# Patient Record
Sex: Male | Born: 1940 | Hispanic: No | Marital: Married | Smoking: Never smoker
Health system: Southern US, Community
[De-identification: ages and names within clinical notes are randomized; demographics above are authoritative.]

## PROBLEM LIST (undated history)

## (undated) DIAGNOSIS — I739 Peripheral vascular disease, unspecified: Secondary | ICD-10-CM

## (undated) DIAGNOSIS — I10 Essential (primary) hypertension: Secondary | ICD-10-CM

## (undated) DIAGNOSIS — I878 Other specified disorders of veins: Secondary | ICD-10-CM

## (undated) DIAGNOSIS — R600 Localized edema: Secondary | ICD-10-CM

## (undated) HISTORY — PX: CHOLECYSTECTOMY: SHX55

## (undated) HISTORY — PX: MINOR HEMORRHOIDECTOMY: SHX6238

---

## 2000-03-09 ENCOUNTER — Encounter: Admission: RE | Admit: 2000-03-09 | Discharge: 2000-06-07 | Payer: Self-pay | Admitting: Family Medicine

## 2001-04-18 ENCOUNTER — Encounter: Payer: Self-pay | Admitting: Internal Medicine

## 2001-04-18 ENCOUNTER — Ambulatory Visit (HOSPITAL_COMMUNITY): Admission: RE | Admit: 2001-04-18 | Discharge: 2001-04-18 | Payer: Self-pay | Admitting: Internal Medicine

## 2001-11-18 ENCOUNTER — Encounter: Payer: Self-pay | Admitting: Internal Medicine

## 2001-11-18 ENCOUNTER — Ambulatory Visit (HOSPITAL_COMMUNITY): Admission: RE | Admit: 2001-11-18 | Discharge: 2001-11-18 | Payer: Self-pay | Admitting: Internal Medicine

## 2001-12-12 ENCOUNTER — Encounter: Payer: Self-pay | Admitting: Orthopedic Surgery

## 2001-12-12 ENCOUNTER — Encounter: Admission: RE | Admit: 2001-12-12 | Discharge: 2001-12-12 | Payer: Self-pay | Admitting: Orthopedic Surgery

## 2002-06-06 ENCOUNTER — Ambulatory Visit (HOSPITAL_BASED_OUTPATIENT_CLINIC_OR_DEPARTMENT_OTHER): Admission: RE | Admit: 2002-06-06 | Discharge: 2002-06-06 | Payer: Self-pay | Admitting: Surgery

## 2002-07-11 ENCOUNTER — Encounter: Payer: Self-pay | Admitting: *Deleted

## 2002-07-11 ENCOUNTER — Emergency Department (HOSPITAL_COMMUNITY): Admission: EM | Admit: 2002-07-11 | Discharge: 2002-07-11 | Payer: Self-pay | Admitting: Podiatry

## 2002-07-15 ENCOUNTER — Encounter: Payer: Self-pay | Admitting: Surgery

## 2002-07-15 ENCOUNTER — Ambulatory Visit (HOSPITAL_COMMUNITY): Admission: RE | Admit: 2002-07-15 | Discharge: 2002-07-16 | Payer: Self-pay | Admitting: Surgery

## 2002-10-29 ENCOUNTER — Ambulatory Visit (HOSPITAL_COMMUNITY): Admission: RE | Admit: 2002-10-29 | Discharge: 2002-10-29 | Payer: Self-pay | Admitting: Internal Medicine

## 2013-06-11 DIAGNOSIS — H401190 Primary open-angle glaucoma, unspecified eye, stage unspecified: Secondary | ICD-10-CM | POA: Insufficient documentation

## 2013-06-11 DIAGNOSIS — H11249 Scarring of conjunctiva, unspecified eye: Secondary | ICD-10-CM | POA: Insufficient documentation

## 2014-02-05 ENCOUNTER — Ambulatory Visit
Admission: RE | Admit: 2014-02-05 | Discharge: 2014-02-05 | Disposition: A | Discharge status: Home / Self Care | Payer: Medicare Other | Source: Ambulatory Visit | Attending: Family Medicine | Admitting: Family Medicine

## 2014-02-05 ENCOUNTER — Other Ambulatory Visit: Payer: Self-pay | Admitting: Family Medicine

## 2014-02-05 DIAGNOSIS — M25552 Pain in left hip: Secondary | ICD-10-CM

## 2014-07-30 ENCOUNTER — Ambulatory Visit (HOSPITAL_BASED_OUTPATIENT_CLINIC_OR_DEPARTMENT_OTHER): Payer: Medicare Other | Attending: Family Medicine

## 2014-07-30 VITALS — Ht 67.0 in | Wt 148.0 lb

## 2014-07-30 DIAGNOSIS — G4733 Obstructive sleep apnea (adult) (pediatric): Secondary | ICD-10-CM | POA: Diagnosis present

## 2014-07-30 DIAGNOSIS — R0683 Snoring: Secondary | ICD-10-CM | POA: Insufficient documentation

## 2014-08-01 NOTE — Sleep Study (Signed)
   NAME: Darren Shaw DATE OF BIRTH:  [birth year] MEDICAL RECORD NUMBER [id number]  LOCATION: [facility]  PHYSICIAN: Omayra Tulloch D  DATE OF STUDY: 07/30/2014  SLEEP STUDY TYPE: Nocturnal Polysomnogram               REFERRING PHYSICIAN: Leilani Ableeese, Betti D, MD  INDICATION FOR STUDY: Hypersomnia with sleep apnea  EPWORTH SLEEPINESS SCORE:   12/24 HEIGHT: 5\' 7"  (170.2 cm)  WEIGHT: 148 lb (67.132 kg)    Body mass index is 23.17 kg/(m^2).  NECK SIZE: 13 in.  MEDICATIONS: Charted for review  SLEEP ARCHITECTURE: Total sleep time 311 minutes with sleep efficiency 81.7%. Stage I was 11.7%, stage II 66.7%, stage III absent, REM 21.5% of total sleep time. Sleep latency 4 minutes, REM latency 176.5 minutes, awake after sleep onset 64.5 minutes, arousal index 6.2, bedtime medication: None  RESPIRATORY DATA: Apnea hypopnea index (AHI) 13.9 per hour. 72 total events scored including 64 obstructive apneas and 8 hypopneas. All events were while supine. REM AHI 0.9 per hour. There were not enough early events to meet protocol requirements for split CPAP titration  OXYGEN DATA: Moderate snoring with oxygen desaturation to a nadir of 88% and mean saturation 96.6% on room air.  CARDIAC DATA: Sinus rhythm with PVCs  MOVEMENT/PARASOMNIA: No significant movement disturbance, bathroom 1  IMPRESSION/ RECOMMENDATION:   1) Mild obstructive sleep apnea/hypopnea syndrome, AHI 13.9 per hour with supine events. REM AHI 0.9 per hour. Moderate snoring with oxygen desaturation to a nadir of 88% and mean saturation 96.6% on room air. 2) There were not enough early events to meet protocol requirements for split CPAP titration on this study. This patient can return for dedicated CPAP titration study if appropriate.   Waymon BudgeYOUNG,Fateh Kindle D Diplomate, American Board of Sleep Medicine  ELECTRONICALLY SIGNED ON:  08/01/2014, 11:50 AM [facility] PH: [phone]   FX: [phone])  [phone] ACCREDITED BY THE AMERICAN ACADEMY OF SLEEP MEDICINE

## 2014-09-01 ENCOUNTER — Ambulatory Visit (HOSPITAL_BASED_OUTPATIENT_CLINIC_OR_DEPARTMENT_OTHER): Payer: Medicare Other | Attending: Family Medicine | Admitting: Radiology

## 2014-09-01 VITALS — Ht 67.0 in | Wt 160.0 lb

## 2014-09-01 DIAGNOSIS — G4733 Obstructive sleep apnea (adult) (pediatric): Secondary | ICD-10-CM | POA: Insufficient documentation

## 2014-09-05 DIAGNOSIS — G4733 Obstructive sleep apnea (adult) (pediatric): Secondary | ICD-10-CM

## 2014-09-05 NOTE — Sleep Study (Signed)
   NAME: Darren Shaw DATE OF BIRTH:  01/19/1941 MEDICAL RECORD NUMBER [id number]  LOCATION: [facility]  PHYSICIAN: YOUNG,CLINTON D  DATE OF STUDY: 09/01/2014  SLEEP STUDY TYPE: Nocturnal Polysomnogram               REFERRING PHYSICIAN: Leilani Ableeese, Betti D, MD  INDICATION FOR STUDY: Hypersomnia with sleep apnea-CPAP titration  EPWORTH SLEEPINESS SCORE:   12/24 HEIGHT: 5\' 7"  (170.2 cm)  WEIGHT: 72.576 kg (160 lb)    Body mass index is 25.05 kg/(m^2).  NECK SIZE: 13 in.  MEDICATIONS: Charted for review  SLEEP ARCHITECTURE: Total sleep time 217.5 minutes with sleep efficiency 55.1%. Stage I was 16.6%, stage II 76.3%, stage III absent, REM 7.1% of total sleep time. Sleep latency 13 minutes, REM latency 201.5 minutes, awake after sleep onset 9 min  RESPIRATORY DATA: CPAP titration protocol. CPAP was titrated to 12 CWP, AHI 0 per hour. He wore a medium fullface mask.  OXYGEN DATA: Snoring was prevented by CPAP and mean oxygen saturation was 97.8% on room air.  CARDIAC DATA: Sinus rhythm with PACs  MOVEMENT/PARASOMNIA: No significant movement disturbance, no bathroom trips  IMPRESSION/ RECOMMENDATION:   1) Successful CPAP titration to 12 CWP, AHI 0 per hour. He wore a medium F&P Simplus fullface mask with heated humidifier. Snoring was prevented and mean oxygen saturation was 97.8% on room air 2) Baseline polysomnogram on 07/30/2014 recorded AHI 13.9 per hour with body weight 148 pounds.   Waymon BudgeYOUNG,CLINTON D Diplomate, American Board of Sleep Medicine  ELECTRONICALLY SIGNED ON:  09/05/2014, 2:53 PM [facility] PH: [phone]   FX: [phone] ACCREDITED BY THE AMERICAN ACADEMY OF SLEEP MEDICINE

## 2015-09-22 DIAGNOSIS — H524 Presbyopia: Secondary | ICD-10-CM | POA: Insufficient documentation

## 2015-09-22 DIAGNOSIS — H353131 Nonexudative age-related macular degeneration, bilateral, early dry stage: Secondary | ICD-10-CM | POA: Insufficient documentation

## 2015-09-22 DIAGNOSIS — H31001 Unspecified chorioretinal scars, right eye: Secondary | ICD-10-CM | POA: Insufficient documentation

## 2015-11-01 ENCOUNTER — Other Ambulatory Visit: Payer: Self-pay | Admitting: Cardiology

## 2015-11-01 DIAGNOSIS — I872 Venous insufficiency (chronic) (peripheral): Secondary | ICD-10-CM

## 2015-11-11 ENCOUNTER — Ambulatory Visit
Admission: RE | Admit: 2015-11-11 | Discharge: 2015-11-11 | Disposition: A | Discharge status: Home / Self Care | Payer: Medicare Other | Source: Ambulatory Visit | Attending: Cardiology | Admitting: Cardiology

## 2015-11-11 DIAGNOSIS — I872 Venous insufficiency (chronic) (peripheral): Secondary | ICD-10-CM

## 2015-11-11 HISTORY — DX: Other specified disorders of veins: I87.8

## 2015-11-11 HISTORY — DX: Peripheral vascular disease, unspecified: I73.9

## 2015-11-11 HISTORY — DX: Essential (primary) hypertension: I10

## 2015-11-11 HISTORY — DX: Localized edema: R60.0

## 2015-11-11 NOTE — Consult Note (Signed)
Chief Complaint: Patient was seen in consultation today for  Chief Complaint  Patient presents with  . Advice Only    Consult for Venous Insufficiency   at the request of Ganji,Jay  Referring Physician(s): Ganji,Jay  History of Present Illness: Darren Shaw is a 75 y.o. male referred for evaluation of bilateral lower extremity swelling. Patient describes this has been going on 4-5 years. He uses compression hose. The swelling resolves with leg elevation, essentially gone in the morning when he wakes up, progressive over the day. No history of varicose or spider vein treatment. No history of superficial thrombophlebitis, DVT, PE, or venous ulceration. There is a family history of varicose/spider veins in his sister. He is a retired Airline pilot, sits for long periods of time. Symptoms do not require pain medications. He also describes bilateral lower extremity numbness after 10-15 minutes of walking or standing, which affect his activities of daily living. Past Medical History  Diagnosis Date  . Hypertension   . Venous intermittent claudication (HCC)   . PAD (peripheral artery disease) (HCC)   . Edema of both legs     Past Surgical History  Procedure Laterality Date  . Cholecystectomy    . Minor hemorrhoidectomy      Allergies: Review of patient's allergies indicates no known allergies.  Medications: Prior to Admission medications   Medication Sig Start Date End Date Taking? Authorizing Provider  amLODipine (NORVASC) 5 MG tablet Take 5 mg by mouth daily.   Yes Historical Provider, MD  atorvastatin (LIPITOR) 10 MG tablet Take 10 mg by mouth daily.   Yes Historical Provider, MD  brimonidine-timolol (COMBIGAN) 0.2-0.5 % ophthalmic solution Place 1 drop into both eyes every 12 (twelve) hours.   Yes Historical Provider, MD  lisinopril-hydrochlorothiazide (PRINZIDE,ZESTORETIC) 20-12.5 MG tablet Take 1 tablet by mouth daily.   Yes Historical Provider, MD  metoprolol tartrate  (LOPRESSOR) 25 MG tablet Take 25 mg by mouth 2 (two) times daily.   Yes Historical Provider, MD  MULTIPLE VITAMIN PO Take 1 capsule by mouth daily.   Yes Historical Provider, MD     No family history on file.  Social History   Social History  . Marital Status: Married    Spouse Name: N/A  . Number of Children: N/A  . Years of Education: N/A   Social History Main Topics  . Smoking status: Never Smoker   . Smokeless tobacco: Never Used  . Alcohol Use: No  . Drug Use: No  . Sexual Activity: Not Asked   Other Topics Concern  . None   Social History Narrative  . None    ECOG Status: 1 - Symptomatic but completely ambulatory  Review of Systems  Review of Systems: A 12 point ROS discussed and pertinent positives are indicated in the HPI above.  All other systems are negative.  Vital Signs: BP 134/65 mmHg  Pulse 77  Temp(Src) 98.4 F (36.9 C) (Oral)  Resp 13  Ht  (1.676 m)  Wt 148 lb (67.132 kg)  BMI 23.90 kg/m2  SpO2 100%  Physical Exam Bilateral 1+ ankle edema. No visible varicose veins. No skin trophic changes, rubor, or ulceration.   Imaging: US Venous Img Lower Bilateral  11/11/2015  CLINICAL DATA:  Bilateral lower extremity swelling EXAM: BILATERAL LOWER EXTREMITY VENOUS DUPLEX ULTRASOUND TECHNIQUE: Gray-scale sonography with graded compression, as well as color Doppler and duplex ultrasound, were performed to evaluate the deep and superficial veins of both lower extremities. Spectral Doppler  was utilized to evaluate flow at rest and with distal augmentation maneuvers. A complete superficial venous insufficiency exam was performed in the upright standing position. I personally performed the technical portion of the exam. COMPARISON:  None. FINDINGS: Deep Venous System: Evaluation of the deep venous systems including the common femoral, femoral, profunda femoral, popliteal and calf veins (where visible) demonstrate no evidence of deep venous thrombosis. The  vessels are compressible and demonstrate normal respiratory phasicity and response to augmentation. No evidence of the deep venous reflux. Superficial Venous System: RIGHT SFJ: Patent, nondilated, no reflux. GSV Mid Thigh: Nondilated, no reflux GSV Lower Thigh: Diminutive, no reflux GSV Prox Calf: Negative GSV Mid Calf: Negative GSV Distal Calf: Negative SPJ: Not visualized SSV Prox: Nondilated, no reflux SSV Mid: Negative SSV Distal: Negative LEFT SFJ: Patent, nondilated, no reflux. GSV Prox Thigh: Negative GSV Mid Thigh: Negative GSV Lower Thigh: Negative GSV Prox Calf: Negative GSV Mid Calf: Negative GSV Distal Calf: Negative. SPJ: Not visualized SSV Prox: Nondilated, no reflux SSV Mid: Negative SSV Distal: Negative Other: There prominent subcutaneous superficial veins noted in the calves bilaterally, up to 3 mm diameter on the right, smaller on the left. IMPRESSION: 1. Normal bilateral deep venous systems.  No DVT. 2. No saphenous venous valvular incompetence, dilatation, or reflux bilaterally. Electronically Signed   By: Corlis Leak  Kazim Corrales M.D.   On: 11/11/2015 10:11     Assessment and Plan:  My impression is that this patient's lower extremity edema is not of primarily venous etiology. Deep venous systems of both lower extremities are normal. Superficial saphenous venous systems of both lower extremities show no dilatation, or venous insufficiency, or reflux. There are a few small superficial saphenous veins noted in the calf regions but no visible varicose veins on exam. I reviewed the findings with the patient. We reviewed the pathophysiology of lower extremity edema and the differential diagnosis. He will plan to follow-up primarily with you.  Thank you for this interesting consult.  I greatly enjoyed meeting Darren Shaw and look forward to participating in their care.  A copy of this report was sent to the requesting provider on this date.  Electronically Signed: Fayette Hamada III, DAYNE Shantice Menger 11/11/2015,  10:40 AM   I spent a total of  30 Minutes   in face to face in clinical consultation, greater than 50% of which was counseling/coordinating care for bilateral lower extremity edema.

## 2016-05-18 ENCOUNTER — Emergency Department (HOSPITAL_COMMUNITY): Payer: Medicare Other

## 2016-05-18 ENCOUNTER — Encounter (HOSPITAL_COMMUNITY): Payer: Self-pay | Admitting: *Deleted

## 2016-05-18 ENCOUNTER — Emergency Department (HOSPITAL_COMMUNITY)
Admission: EM | Admit: 2016-05-18 | Discharge: 2016-05-18 | Disposition: A | Discharge status: Home / Self Care | Payer: Medicare Other | Attending: Emergency Medicine | Admitting: Emergency Medicine

## 2016-05-18 DIAGNOSIS — J0130 Acute sphenoidal sinusitis, unspecified: Secondary | ICD-10-CM | POA: Insufficient documentation

## 2016-05-18 DIAGNOSIS — I10 Essential (primary) hypertension: Secondary | ICD-10-CM | POA: Diagnosis not present

## 2016-05-18 DIAGNOSIS — Z79899 Other long term (current) drug therapy: Secondary | ICD-10-CM | POA: Insufficient documentation

## 2016-05-18 DIAGNOSIS — R791 Abnormal coagulation profile: Secondary | ICD-10-CM | POA: Insufficient documentation

## 2016-05-18 DIAGNOSIS — R05 Cough: Secondary | ICD-10-CM | POA: Diagnosis present

## 2016-05-18 DIAGNOSIS — R42 Dizziness and giddiness: Secondary | ICD-10-CM | POA: Insufficient documentation

## 2016-05-18 LAB — RAPID URINE DRUG SCREEN, HOSP PERFORMED
Amphetamines: NOT DETECTED
BARBITURATES: NOT DETECTED
BENZODIAZEPINES: NOT DETECTED
COCAINE: NOT DETECTED
Opiates: NOT DETECTED
Tetrahydrocannabinol: NOT DETECTED

## 2016-05-18 LAB — URINALYSIS, ROUTINE W REFLEX MICROSCOPIC
Bilirubin Urine: NEGATIVE
GLUCOSE, UA: NEGATIVE mg/dL
Hgb urine dipstick: NEGATIVE
Ketones, ur: NEGATIVE mg/dL
LEUKOCYTES UA: NEGATIVE
Nitrite: NEGATIVE
PROTEIN: NEGATIVE mg/dL
SPECIFIC GRAVITY, URINE: 1.018 (ref 1.005–1.030)
pH: 6.5 (ref 5.0–8.0)

## 2016-05-18 LAB — DIFFERENTIAL
BASOS PCT: 0 %
Basophils Absolute: 0 10*3/uL (ref 0.0–0.1)
EOS ABS: 0.3 10*3/uL (ref 0.0–0.7)
EOS PCT: 4 %
LYMPHS ABS: 3.1 10*3/uL (ref 0.7–4.0)
Lymphocytes Relative: 50 %
MONO ABS: 0.7 10*3/uL (ref 0.1–1.0)
Monocytes Relative: 11 %
Neutro Abs: 2.2 10*3/uL (ref 1.7–7.7)
Neutrophils Relative %: 35 %

## 2016-05-18 LAB — COMPREHENSIVE METABOLIC PANEL
ALT: 14 U/L — ABNORMAL LOW (ref 17–63)
ANION GAP: 9 (ref 5–15)
AST: 22 U/L (ref 15–41)
Albumin: 4.6 g/dL (ref 3.5–5.0)
Alkaline Phosphatase: 114 U/L (ref 38–126)
BILIRUBIN TOTAL: 0.5 mg/dL (ref 0.3–1.2)
BUN: 28 mg/dL — ABNORMAL HIGH (ref 6–20)
CHLORIDE: 101 mmol/L (ref 101–111)
CO2: 26 mmol/L (ref 22–32)
Calcium: 9.4 mg/dL (ref 8.9–10.3)
Creatinine, Ser: 0.97 mg/dL (ref 0.61–1.24)
Glucose, Bld: 117 mg/dL — ABNORMAL HIGH (ref 65–99)
POTASSIUM: 3.5 mmol/L (ref 3.5–5.1)
Sodium: 136 mmol/L (ref 135–145)
TOTAL PROTEIN: 8.1 g/dL (ref 6.5–8.1)

## 2016-05-18 LAB — CBC
HEMATOCRIT: 33.7 % — AB (ref 39.0–52.0)
Hemoglobin: 10.2 g/dL — ABNORMAL LOW (ref 13.0–17.0)
MCH: 19.3 pg — ABNORMAL LOW (ref 26.0–34.0)
MCHC: 30.3 g/dL (ref 30.0–36.0)
MCV: 63.7 fL — AB (ref 78.0–100.0)
PLATELETS: 334 10*3/uL (ref 150–400)
RBC: 5.29 MIL/uL (ref 4.22–5.81)
RDW: 20.5 % — AB (ref 11.5–15.5)
WBC: 6.3 10*3/uL (ref 4.0–10.5)

## 2016-05-18 LAB — I-STAT TROPONIN, ED: TROPONIN I, POC: 0 ng/mL (ref 0.00–0.08)

## 2016-05-18 LAB — ETHANOL

## 2016-05-18 LAB — PROTIME-INR
INR: 1.09
PROTHROMBIN TIME: 14.1 s (ref 11.4–15.2)

## 2016-05-18 LAB — APTT: APTT: 41 s — AB (ref 24–36)

## 2016-05-18 MED ORDER — AMOXICILLIN-POT CLAVULANATE 875-125 MG PO TABS: 1.0000 | ORAL_TABLET | Freq: Two times a day (BID) | ORAL | 0 refills | Status: DC

## 2016-05-18 MED ORDER — SODIUM CHLORIDE 0.9 % IV BOLUS (SEPSIS)
1000.0000 mL | Freq: Once | INTRAVENOUS | Status: AC
  Administered 2016-05-18: 1000 mL via INTRAVENOUS

## 2016-05-18 NOTE — Discharge Instructions (Signed)
EXAM: MRI HEAD WITHOUT CONTRAST   TECHNIQUE: Multiplanar, multiecho pulse sequences of the brain and surrounding structures were obtained without intravenous contrast.   COMPARISON:  CT HEAD May 18, 2016   FINDINGS: BRAIN: No reduced diffusion to suggest acute ischemia. No susceptibility artifact to suggest hemorrhage. The ventricles and sulci are normal for patient's age. No suspicious parenchymal signal, masses or mass effect. Patchy supratentorial white matter FLAIR T2 hyperintensities. No abnormal extra-axial fluid collections. No extra-axial masses though, contrast enhanced sequences would be more sensitive.   VASCULAR: Normal major intracranial vascular flow voids present at skull base.   SKULL AND UPPER CERVICAL SPINE: No abnormal sellar expansion. No suspicious calvarial bone marrow signal. Craniocervical junction maintained.   SINUSES/ORBITS: LEFT sphenoethmoidal sinusitis. Mastoid air cells are well aerated. Bilateral ocular lens implants. The included ocular globes and orbital contents are non-suspicious. Old LEFT medial orbital blowout fracture.   OTHER: None.   IMPRESSION: No acute intracranial process.   LEFT sphenoethmoidal sinusitis.   Negative MRI of the head for age (involutional changes and mild chronic small vessel ischemic disease) .     Electronically Signed   By: Awilda Metroourtnay  Bloomer M.D.   On: 05/18/2016 18:34  CLINICAL DATA:  75 year old male with shortness of breath and cough for 2 weeks.   EXAM: CHEST  2 VIEW   COMPARISON:  None.   FINDINGS: The cardiomediastinal silhouette is unremarkable.   There is no evidence of focal airspace disease, pulmonary edema, suspicious pulmonary nodule/mass, pleural effusion, or pneumothorax. No acute bony abnormalities are identified.   IMPRESSION: No active cardiopulmonary disease.     Electronically Signed   By: Harmon PierJeffrey  Hu M.D.   On: 05/18/2016 13:48   Ref Range & Units 13:08  WBC 4.0  - 10.5 K/uL 6.3   RBC 4.22 - 5.81 MIL/uL 5.29   Hemoglobin 13.0 - 17.0 g/dL 29.510.2    HCT 62.139.0 - 30.852.0 % 33.7    MCV 78.0 - 100.0 fL 63.7    MCH 26.0 - 34.0 pg 19.3    MCHC 30.0 - 36.0 g/dL 65.730.3   RDW 84.611.5 - 96.215.5 % 20.5    Platelets 150 - 400 K/uL 334     Ref Range & Units 13:08  Sodium 135 - 145 mmol/L 136   Potassium 3.5 - 5.1 mmol/L 3.5   Chloride 101 - 111 mmol/L 101   CO2 22 - 32 mmol/L 26   Glucose, Bld 65 - 99 mg/dL 952117    BUN 6 - 20 mg/dL 28    Creatinine, Ser 8.410.61 - 1.24 mg/dL 3.240.97   Calcium 8.9 - 40.110.3 mg/dL 9.4   Total Protein 6.5 - 8.1 g/dL 8.1   Albumin 3.5 - 5.0 g/dL 4.6   AST 15 - 41 U/L 22   ALT 17 - 63 U/L 14    Alkaline Phosphatase 38 - 126 U/L 114   Total Bilirubin 0.3 - 1.2 mg/dL 0.5   GFR calc non Af Amer >60 mL/min >60   GFR calc Af Amer >60 mL/min >60   Comments: (NOTE)

## 2016-05-18 NOTE — ED Notes (Signed)
Completed swallow screen, MD to notify this RN if POC occult is still needed.

## 2016-05-18 NOTE — ED Triage Notes (Signed)
Pt reports dizziness for the past three days.  Last night pt had a fall but denies hitting his head or LOC.  Pt reports some back pain from the fall. Pt describes the dizziness as the room is spinning.  Pt denies any nausea but does report being really tired today.  Pt a/o x 4 and normally ambulatory.  Pt also reports his feet feel like they are burning.

## 2016-05-18 NOTE — ED Provider Notes (Signed)
[facility]-EMERGENCY DEPT Provider Note   CSN: [id number] Arrival date & time: 05/18/16  1047     History   Chief Complaint Chief Complaint  Patient presents with  . Dizziness    HPI Matthias Hughssegaye Charlie is a 75 y.o. male.  HPI  75 year old male presents with dizziness for 3 days. He states that it comes and goes, mostly when sitting or standing. The dizziness as a room spinning sensation. Denies any shortness of breath. Yesterday was so bad that he fell backwards. He did not injure himself. He has chronic low back pain for several years that is not worse after the fall. No focal weakness or numbness. Denies any ear ringing or ear pain. He is been having a cough for 15 days and states he had the flu last week. No current fever or neck pain. He has burning at the top of his head posteriorly. 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 No chest pain or shortness of breath.  Past Medical History:  Diagnosis Date  . Edema of both legs   . Hypertension   . PAD (peripheral artery disease) (HCC)   . Venous intermittent claudication     There are no active problems to display for this patient.   Past Surgical History:  Procedure Laterality Date  . CHOLECYSTECTOMY    . MINOR HEMORRHOIDECTOMY         Home Medications    Prior to Admission medications   Medication Sig Start Date End Date Taking? Authorizing Provider  brimonidine-timolol (COMBIGAN) 0.2-0.5 % ophthalmic solution Place 1 drop into both eyes every 12 (twelve) hours.   Yes Historical Provider, MD  dextromethorphan-guaiFENesin (MUCINEX DM) 30-600 MG 12hr tablet Take 1 tablet by mouth 2 (two) times daily.   Yes Historical Provider, MD  metoprolol tartrate (LOPRESSOR) 25 MG tablet Take 25 mg by mouth 2 (two) times daily.   Yes Historical Provider, MD  Multiple Vitamin (MULTIVITAMIN WITH MINERALS) TABS tablet Take 1 tablet by mouth daily.   Yes Historical Provider, MD  triamterene-hydrochlorothiazide  (MAXZIDE-25) 37.5-25 MG tablet Take 1 tablet by mouth daily.   Yes Historical Provider, MD    Family History No family history on file.  Social History Social History  Substance Use Topics  . Smoking status: Never Smoker  . Smokeless tobacco: Never Used  . Alcohol use No     Allergies   Patient has no known allergies.   Review of Systems Review of Systems  Constitutional: Negative for fever.  Respiratory: Positive for cough. Negative for shortness of breath.   Cardiovascular: Negative for chest pain.  Gastrointestinal: Negative for abdominal pain, nausea and vomiting.  Musculoskeletal: Positive for back pain (chronic, mild, unchanged).  Neurological: Positive for dizziness and headaches. Negative for weakness and numbness.  All other systems reviewed and are negative.    Physical Exam Updated Vital Signs BP 138/60 (BP Location: Left Arm)   Pulse 83   Temp 97.7 F (36.5 C) (Oral)   Resp 18   Ht 5\' 6"  (1.676 m)   Wt 147 lb (66.7 kg)   SpO2 100%   BMI 23.73 kg/m   Physical Exam  Constitutional: He is oriented to person, place, and time. He appears well-developed and well-nourished.  HENT:  Head: Normocephalic and atraumatic.  Right Ear: External ear normal.  Left Ear: External ear normal.  Nose: Nose normal.  Eyes: EOM are normal. Pupils are equal, round, and reactive to light. Right eye exhibits no discharge. Left eye exhibits  no discharge.  No obvious nystagmus  Neck: Normal range of motion. Neck supple.  Cardiovascular: Normal rate, regular rhythm and normal heart sounds.   Pulmonary/Chest: Effort normal and breath sounds normal.  Abdominal: Soft. He exhibits no distension. There is no tenderness.  Musculoskeletal: He exhibits no edema.  No back tenderness  Neurological: He is alert and oriented to person, place, and time.  CN 3-12 grossly intact. 5/5 strength in all 4 extremities. Grossly normal sensation. Normal finger to nose. Normal gait  Skin: Skin is  warm and dry.  Nursing note and vitals reviewed.    ED Treatments / Results  Labs (all labs ordered are listed, but only abnormal results are displayed) Labs Reviewed  ETHANOL  PROTIME-INR  APTT  CBC  DIFFERENTIAL  COMPREHENSIVE METABOLIC PANEL  RAPID URINE DRUG SCREEN, HOSP PERFORMED  URINALYSIS, ROUTINE W REFLEX MICROSCOPIC (NOT [facility])  I-STAT TROPOININ, ED    EKG  EKG Interpretation None       Radiology No results found.  Procedures Procedures (including critical care time)  Medications Ordered in ED Medications  sodium chloride 0.9 % bolus 1,000 mL (not administered)     Initial Impression / Assessment and Plan / ED Course  I have reviewed the triage vital signs and the nursing notes.  Pertinent labs & imaging results that were available during my care of the patient were reviewed by me and considered in my medical decision making (see chart for details).  Clinical Course as of May 18 1729  Thu May 18, 2016  1257 States he is "slightly dizzy" now. No obvious nystagmus. Able to walk. Neuro exam benign. Given age will need to r/o CNS pathology. Given head "burning", will start with CT head, labs.  [SG]  1427 Workup thus far negative. Hgb 10.5 on labs checked yesterday. No dark or bloody stools. States he has history of anemia. Do not think GI bleed likely or that this is causing symptoms. Will get MRI based on age and vertigo.  [SG]    Clinical Course User Index [SG] Pricilla LovelessScott Berish Bohman, MD    Dr. Madilyn Hookees to follow up on MRI. His gait was normal here. If negative, probably a post-viral issue.  Final Clinical Impressions(s) / ED Diagnoses   Final diagnoses:  None    New Prescriptions New Prescriptions   No medications on file     Pricilla LovelessScott Shateka Petrea, MD 05/18/16 1731

## 2016-05-18 NOTE — ED Provider Notes (Signed)
Patient visit shared with Dr. Criss AlvineGoldston.  Pt here with dizziness for two days.  He is feeling improved on repeat evaluation. He is able to ambulate the department. MRI with questionable sinusitis, we will treat given his  symptoms of dizziness and cough. Discussed PCP follow-up, home care, return precautions.   Darren FossaElizabeth Kamir Selover, MD 05/19/16 [phone]

## 2016-12-11 DIAGNOSIS — IMO0001 Reserved for inherently not codable concepts without codable children: Secondary | ICD-10-CM | POA: Insufficient documentation

## 2016-12-11 DIAGNOSIS — Z9689 Presence of other specified functional implants: Secondary | ICD-10-CM | POA: Insufficient documentation

## 2017-02-05 DIAGNOSIS — H44422 Hypotony of left eye due to ocular fistula: Secondary | ICD-10-CM | POA: Insufficient documentation

## 2017-02-05 DIAGNOSIS — H31422 Serous choroidal detachment, left eye: Secondary | ICD-10-CM | POA: Insufficient documentation

## 2017-02-05 DIAGNOSIS — H209 Unspecified iridocyclitis: Secondary | ICD-10-CM | POA: Insufficient documentation

## 2017-06-19 DIAGNOSIS — H26491 Other secondary cataract, right eye: Secondary | ICD-10-CM | POA: Insufficient documentation

## 2017-11-29 ENCOUNTER — Other Ambulatory Visit: Payer: Self-pay | Admitting: Internal Medicine

## 2017-11-29 DIAGNOSIS — M5440 Lumbago with sciatica, unspecified side: Secondary | ICD-10-CM

## 2017-12-04 ENCOUNTER — Other Ambulatory Visit: Payer: Medicare Other

## 2017-12-05 ENCOUNTER — Ambulatory Visit
Admission: RE | Admit: 2017-12-05 | Discharge: 2017-12-05 | Disposition: A | Discharge status: Home / Self Care | Payer: Medicare Other | Source: Ambulatory Visit | Attending: Internal Medicine | Admitting: Internal Medicine

## 2017-12-05 DIAGNOSIS — M5440 Lumbago with sciatica, unspecified side: Secondary | ICD-10-CM

## 2017-12-31 ENCOUNTER — Other Ambulatory Visit: Payer: Self-pay

## 2017-12-31 ENCOUNTER — Emergency Department (HOSPITAL_COMMUNITY): Payer: Medicare Other

## 2017-12-31 ENCOUNTER — Encounter (HOSPITAL_COMMUNITY): Payer: Self-pay

## 2017-12-31 ENCOUNTER — Emergency Department (HOSPITAL_COMMUNITY)
Admission: EM | Admit: 2017-12-31 | Discharge: 2017-12-31 | Disposition: A | Discharge status: Home / Self Care | Payer: Medicare Other | Attending: Emergency Medicine | Admitting: Emergency Medicine

## 2017-12-31 DIAGNOSIS — B349 Viral infection, unspecified: Secondary | ICD-10-CM | POA: Insufficient documentation

## 2017-12-31 DIAGNOSIS — I10 Essential (primary) hypertension: Secondary | ICD-10-CM | POA: Diagnosis not present

## 2017-12-31 DIAGNOSIS — R509 Fever, unspecified: Secondary | ICD-10-CM | POA: Diagnosis present

## 2017-12-31 DIAGNOSIS — R1030 Lower abdominal pain, unspecified: Secondary | ICD-10-CM | POA: Insufficient documentation

## 2017-12-31 DIAGNOSIS — Z79899 Other long term (current) drug therapy: Secondary | ICD-10-CM | POA: Diagnosis not present

## 2017-12-31 LAB — URINALYSIS, ROUTINE W REFLEX MICROSCOPIC
BILIRUBIN URINE: NEGATIVE
GLUCOSE, UA: NEGATIVE mg/dL
HGB URINE DIPSTICK: NEGATIVE
KETONES UR: NEGATIVE mg/dL
Leukocytes, UA: NEGATIVE
Nitrite: NEGATIVE
PROTEIN: NEGATIVE mg/dL
Specific Gravity, Urine: 1.021 (ref 1.005–1.030)
pH: 5 (ref 5.0–8.0)

## 2017-12-31 LAB — COMPREHENSIVE METABOLIC PANEL
ALT: 18 U/L (ref 0–44)
ANION GAP: 12 (ref 5–15)
AST: 27 U/L (ref 15–41)
Albumin: 3.4 g/dL — ABNORMAL LOW (ref 3.5–5.0)
Alkaline Phosphatase: 64 U/L (ref 38–126)
BUN: 40 mg/dL — ABNORMAL HIGH (ref 8–23)
CALCIUM: 8.8 mg/dL — AB (ref 8.9–10.3)
CHLORIDE: 102 mmol/L (ref 98–111)
CO2: 22 mmol/L (ref 22–32)
CREATININE: 1.74 mg/dL — AB (ref 0.61–1.24)
GFR, EST AFRICAN AMERICAN: 42 mL/min — AB (ref >60)
GFR, EST NON AFRICAN AMERICAN: 36 mL/min — AB (ref >60)
Glucose, Bld: 111 mg/dL — ABNORMAL HIGH (ref 70–99)
Potassium: 3.8 mmol/L (ref 3.5–5.1)
SODIUM: 136 mmol/L (ref 135–145)
Total Bilirubin: 1.1 mg/dL (ref 0.3–1.2)
Total Protein: 6.9 g/dL (ref 6.5–8.1)

## 2017-12-31 LAB — CBC
HCT: 41 % (ref 39.0–52.0)
Hemoglobin: 14.4 g/dL (ref 13.0–17.0)
MCH: 32.5 pg (ref 26.0–34.0)
MCHC: 35.1 g/dL (ref 30.0–36.0)
MCV: 92.6 fL (ref 78.0–100.0)
PLATELETS: 184 10*3/uL (ref 150–400)
RBC: 4.43 MIL/uL (ref 4.22–5.81)
RDW: 13.4 % (ref 11.5–15.5)
WBC: 16.8 10*3/uL — ABNORMAL HIGH (ref 4.0–10.5)

## 2017-12-31 LAB — GROUP A STREP BY PCR: Group A Strep by PCR: NOT DETECTED

## 2017-12-31 LAB — LIPASE, BLOOD: LIPASE: 38 U/L (ref 11–51)

## 2017-12-31 MED ORDER — IOPAMIDOL (ISOVUE-300) INJECTION 61%
INTRAVENOUS | Status: AC
  Filled 2017-12-31: qty 100

## 2017-12-31 MED ORDER — SODIUM CHLORIDE 0.9 % IV BOLUS
1000.0000 mL | Freq: Once | INTRAVENOUS | Status: AC
  Administered 2017-12-31: 1000 mL via INTRAVENOUS

## 2017-12-31 MED ORDER — IOPAMIDOL (ISOVUE-300) INJECTION 61%
80.0000 mL | Freq: Once | INTRAVENOUS | Status: AC | PRN
  Administered 2017-12-31: 80 mL via INTRAVENOUS

## 2017-12-31 MED ORDER — SODIUM CHLORIDE 0.9 % IV BOLUS: 500.0000 mL | Freq: Once | INTRAVENOUS | Status: DC

## 2017-12-31 NOTE — Discharge Instructions (Addendum)
You were seen in the emergency department today for fever, abdominal pain, and upper respiratory infection type symptoms.  Your work-up in the emergency department was reassuring.  Your chest x-ray was normal.  Your strep test was negative.  The CT of your abdomen did not show an obvious bacterial infection.  Your creatinine, a measure of kidney function, was elevated at 1.74, please be sure to maintain good hydration, this will need to be rechecked by your primary care provider.  Take Tylenol per over-the-counter dosing for any discomfort you have.  Please follow-up with your primary care provider this Wednesday for recheck of left and for reevaluation of your symptoms.  Return to the ER anytime for new or worsening symptoms including but not limited to fever not improved with Tylenol, inability to keep fluids down, worsening pain, chest pain, trouble breathing, passing out or any other concerns that you may have.

## 2017-12-31 NOTE — ED Provider Notes (Signed)
[facility]-EMERGENCY DEPT Provider Note   CSN: [id number] Arrival date & time: 12/31/17  1544   History   Chief Complaint Chief Complaint  Patient presents with  . Fever  . elevated heart rate  . Sore Throat  . Otalgia  . Abdominal Pain    HPI Darren Shaw is a 77 y.o. male with a hx of HTN, PAD, lower extremity edema, and cholecystectomy who presents to the ED at request of his PCP for fever/chills, URI sxs, abdominal pain and abnormal vitals. Patient states that yesterday was a fairly normal day for him. He went to bed last night and started to feel ill with subjective fevers, chills, sore throat, and bilateral ear pain. He also had some suprapubic abdominal pain described as a burning. He mentions urinary frequency as well. Last night his overall discomfort was more severe, pain was 10/10 in severity. Today he is feeling somewhat better. Seen by PCP and HR was 117, BP 80/50, temp 99.6, he was sent to the ED for further evaluation. Patient rates current overall discomfort a 5/10 in severity, no specific alleviating/aggravating factors, no interventions prior to arrival. Denies chest pain, dyspnea, lightheadedness, syncope, cough, trouble swallowing, nausea, vomiting, or diarrhea. Denies recent foreign travel or abx.   HPI  Past Medical History:  Diagnosis Date  . Edema of both legs   . Hypertension   . PAD (peripheral artery disease) (HCC)   . Venous intermittent claudication     There are no active problems to display for this patient.   Past Surgical History:  Procedure Laterality Date  . CHOLECYSTECTOMY    . MINOR HEMORRHOIDECTOMY          Home Medications    Prior to Admission medications   Medication Sig Start Date End Date Taking? Authorizing Provider  amoxicillin-clavulanate (AUGMENTIN) 875-125 MG tablet Take 1 tablet by mouth every 12 (twelve) hours. 05/18/16   Tilden Fossaees, Elizabeth, MD  brimonidine-timolol (COMBIGAN) 0.2-0.5 % ophthalmic  solution Place 1 drop into both eyes every 12 (twelve) hours.    [provider]  dextromethorphan-guaiFENesin (MUCINEX DM) 30-600 MG 12hr tablet Take 1 tablet by mouth 2 (two) times daily.    [provider]  metoprolol tartrate (LOPRESSOR) 25 MG tablet Take 25 mg by mouth 2 (two) times daily.    [provider]  Multiple Vitamin (MULTIVITAMIN WITH MINERALS) TABS tablet Take 1 tablet by mouth daily.    [provider]  triamterene-hydrochlorothiazide (MAXZIDE-25) 37.5-25 MG tablet Take 1 tablet by mouth daily.    [provider]    Family History History reviewed. No pertinent family history.  Social History Social History   Tobacco Use  . Smoking status: Never Smoker  . Smokeless tobacco: Never Used  Substance Use Topics  . Alcohol use: No    Alcohol/week: 0.0 oz  . Drug use: No     Allergies   Patient has no known allergies.   Review of Systems Review of Systems  Constitutional: Positive for chills and fever.  HENT: Positive for congestion, ear pain and sore throat.   Respiratory: Negative for cough and shortness of breath.   Cardiovascular: Negative for chest pain.  Gastrointestinal: Positive for abdominal pain. Negative for diarrhea, nausea and vomiting.  Genitourinary: Positive for frequency. Negative for dysuria.  All other systems reviewed and are negative.  Physical Exam Updated Vital Signs BP (!) 107/57 (BP Location: Left Arm)   Pulse 89   Temp 98.1 F (36.7 C) (Oral)  Resp 16   Ht 5\' 9"  (1.753 m)   Wt 66.2 kg (146 lb)   SpO2 99%   BMI 21.56 kg/m   Physical Exam  Constitutional: He appears well-developed and well-nourished.  Non-toxic appearance. No distress.  HENT:  Head: Normocephalic and atraumatic.  Right Ear: Tympanic membrane is not perforated, not erythematous, not retracted and not bulging.  Left Ear: Tympanic membrane is not perforated, not erythematous, not retracted and not bulging.  Nose: Nose  normal.  Mouth/Throat: Uvula is midline. Posterior oropharyngeal erythema present. No oropharyngeal exudate.  Patient is tolerating his own secretions without difficulty.  No trismus.  No drooling.  No hot potato voice.  Eyes: Conjunctivae are normal. Right eye exhibits no discharge. Left eye exhibits no discharge.  Neck: Normal range of motion. Neck supple.  Cardiovascular: Normal rate and regular rhythm.  No murmur heard. Pulmonary/Chest: Effort normal and breath sounds normal. No respiratory distress. He has no wheezes. He has no rales.  Abdominal: Soft. He exhibits no distension and no mass. There is tenderness (mild) in the periumbilical area and suprapubic area. There is no rigidity, no rebound, no guarding and no CVA tenderness.  Lymphadenopathy:    He has cervical adenopathy (mild anterior).  Neurological: He is alert.  Clear speech.   Skin: Skin is warm and dry. No rash noted.  Psychiatric: He has a normal mood and affect. His behavior is normal. Thought content normal.  Nursing note and vitals reviewed.  ED Treatments / Results  Labs Results for orders placed or performed during the hospital encounter of 12/31/17  Group A Strep by PCR  Result Value Ref Range   Group A Strep by PCR NOT DETECTED NOT DETECTED  Lipase, blood  Result Value Ref Range   Lipase 38 11 - 51 U/L  Comprehensive metabolic panel  Result Value Ref Range   Sodium 136 135 - 145 mmol/L   Potassium 3.8 3.5 - 5.1 mmol/L   Chloride 102 98 - 111 mmol/L   CO2 22 22 - 32 mmol/L   Glucose, Bld 111 (H) 70 - 99 mg/dL   BUN 40 (H) 8 - 23 mg/dL   Creatinine, Ser 9.14 (H) 0.61 - 1.24 mg/dL   Calcium 8.8 (L) 8.9 - 10.3 mg/dL   Total Protein 6.9 6.5 - 8.1 g/dL   Albumin 3.4 (L) 3.5 - 5.0 g/dL   AST 27 15 - 41 U/L   ALT 18 0 - 44 U/L   Alkaline Phosphatase 64 38 - 126 U/L   Total Bilirubin 1.1 0.3 - 1.2 mg/dL   GFR calc non Af Amer 36 (L) >60 mL/min   GFR calc Af Amer 42 (L) >60 mL/min   Anion gap 12 5 - 15    CBC  Result Value Ref Range   WBC 16.8 (H) 4.0 - 10.5 K/uL   RBC 4.43 4.22 - 5.81 MIL/uL   Hemoglobin 14.4 13.0 - 17.0 g/dL   HCT 78.2 95.6 - 21.3 %   MCV 92.6 78.0 - 100.0 fL   MCH 32.5 26.0 - 34.0 pg   MCHC 35.1 30.0 - 36.0 g/dL   RDW 08.6 57.8 - 46.9 %   Platelets 184 150 - 400 K/uL  Urinalysis, Routine w reflex microscopic  Result Value Ref Range   Color, Urine AMBER (A) YELLOW   APPearance HAZY (A) CLEAR   Specific Gravity, Urine 1.021 1.005 - 1.030   pH 5.0 5.0 - 8.0   Glucose, UA NEGATIVE NEGATIVE mg/dL  Hgb urine dipstick NEGATIVE NEGATIVE   Bilirubin Urine NEGATIVE NEGATIVE   Ketones, ur NEGATIVE NEGATIVE mg/dL   Protein, ur NEGATIVE NEGATIVE mg/dL   Nitrite NEGATIVE NEGATIVE   Leukocytes, UA NEGATIVE NEGATIVE   EKG None  Radiology Dg Chest 2 View  Result Date: 12/31/2017 CLINICAL DATA:  Fevers and chills. EXAM: CHEST - 2 VIEW COMPARISON:  Chest x-ray dated May 18, 2016. FINDINGS: The heart size and mediastinal contours are within normal limits. Both lungs are clear. The visualized skeletal structures are unremarkable. IMPRESSION: No active cardiopulmonary disease. Electronically Signed   By: Obie Dredge M.D.   On: 12/31/2017 17:27   Ct Abdomen Pelvis W Contrast  Result Date: 12/31/2017 CLINICAL DATA:  77 year old male with fever, chills, elevated heart rate, earache and sore throat since yesterday. Bilateral lower abdominal pain starting last night. Prior cholecystectomy. Initial encounter. EXAM: CT ABDOMEN AND PELVIS WITH CONTRAST TECHNIQUE: Multidetector CT imaging of the abdomen and pelvis was performed using the standard protocol following bolus administration of intravenous contrast. CONTRAST:  80mL ISOVUE-300 IOPAMIDOL (ISOVUE-300) INJECTION 61% COMPARISON:  No comparison CT.  Prior lumbar spine MR 12/05/2017. FINDINGS: Lower chest: Minimal basilar atelectasis/scarring. Heart size within normal limits. Mitral valve calcification. Hepatobiliary: No  worrisome hepatic lesion. Post cholecystectomy. No calcified common bile duct stone. Pancreas: No worrisome pancreatic mass or inflammation. Spleen: No splenic mass or enlargement. Adrenals/Urinary Tract: Horseshoe kidney. No obstructing stone or hydronephrosis. Subcentimeter cyst to the right of midline. No adrenal mass. Noncontrast filled under distended views of the urinary bladder reveal mild circumferential wall thickening. Stomach/Bowel: Prominent diverticula throughout the colon. No focal diverticulitis noted. Portions of the colon, particularly the ascending colon, are under distended and evaluation limited. Appendix top-normal size. Trace amount of free fluid in the pelvis suggesting there may be a bowel inflammatory process although source not delineated on the present exam. No primary stomach or small bowel abnormality detected. Vascular/Lymphatic: Prominent atherosclerotic changes abdominal aorta and aortic branch vessels. No abdominal aortic aneurysm or large vessel occlusion. Scattered normal size lymph nodes without adenopathy. Reproductive: Slightly prominent prostate gland with prostate gland calcifications. Other: No free air or bowel containing hernia. Musculoskeletal: Lumbar spine degenerative changes with significant spinal stenosis L4-5. Mild hip joint degenerative changes. IMPRESSION: Prominent diverticula throughout the colon. No focal inflammation of diverticula noted. Underdistended portions of colon, particularly ascending colon, limited evaluation for detection of colitis however, no surrounding extraluminal inflammation noted. Appendix top-normal in size. Trace amount of free fluid in the left pelvis suggesting there may be a bowel inflammatory process although source not delineated on the present exam. Horseshoe kidney. Aortic Atherosclerosis (ICD10-I70.0). Slightly prominent prostate gland. 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 These results were called by telephone at the time of  interpretation on 12/31/2017 at 6:47 pm to [facility], who verbally acknowledged these results. Electronically Signed   By: Lacy Duverney M.D.   On: 12/31/2017 18:46    Procedures Procedures (including critical care time)  Medications Ordered in ED Medications  iopamidol (ISOVUE-300) 61 % injection 80 mL (80 mLs Intravenous Contrast Given 12/31/17 1754)  sodium chloride 0.9 % bolus 1,000 mL (0 mLs Intravenous Stopped 12/31/17 1922)     Initial Impression / Assessment and Plan / ED Course  I have reviewed the triage vital signs and the nursing notes.  Pertinent labs & imaging results that were available during my care of the patient were reviewed by me and considered in my medical decision making (see chart for details).   Patient  presents to the ED at request of PCP for subjective fever/chills, URI sxs, abdominal discomfort, and concern for low BP and high HR. Upon arrival to the ED patient appears to be resting comfortably, initial pressure a bit soft, HR normal, afebrile. Abdomen mildly tender, soft, no peritoneal signs. Will obtain basic labs, strep swab, CXR, and CT abdomen/pelvis.   Labs notable for elevated Creatinine/BUN- 1.74/40 respectively- this is increased from previous on record 1 year ago 0.97/28- will give 1L over 2 hours in the ER. Lipase and LFTs WNL. Mild hypocalcemia at 8.8, otherwise electrolytes WNL. Nonspecific leukocytosis at 16.8. No anemia. UA without obvious infection, given sxs culture sent. Strep negative. CXR negative, no infiltrate. CT abdomen/pelvis with some nonspecific findings, no extraluminal inflammation of the colon, however difficult evaluation for colitis, trace amount of free fluid in the left pelvis without delineated source. Patient with mild belly exam, his CT is somewhat inconclusive. Discussed all findings/results and plan of care with supervising physician Dr. Jeraldine Loots who has personally evaluated and examined this patient- suspect likely  viral, possible enteritis, given patient is afebrile in the ER, vitals stable, will discharge home with encouraged PO fluids (especially in setting of elevated creatinine), tylenol for pain, and close PCP follow up with strict return precautions. I discussed results, treatment plan, need for PCP follow-up, and return precautions with the patient and his wife. Provided opportunity for questions, patient and his wife confirmed understanding and are in agreement with plan.   Vitals:   12/31/17 1816 12/31/17 2023  BP: 108/68 122/67  Pulse: 89 89  Resp: 16 16  Temp:  97.7 F (36.5 C)  SpO2: 99% 100%     Final Clinical Impressions(s) / ED Diagnoses   Final diagnoses:  Viral illness    ED Discharge Orders    None       Cherly Anderson, PA-C 12/31/17 2335    Gerhard Munch, MD 12/31/17 2348

## 2017-12-31 NOTE — ED Triage Notes (Addendum)
Patient went to PCP today and was sent to the ED for fever, chills, elevated heart rate, ear ache and sore throat since yesterday. Patient states his heart rate was 113.  Patient added that he was also having bilateral lower abdominal pain that started lst night.

## 2018-01-01 LAB — URINE CULTURE: Culture: NO GROWTH

## 2018-01-07 ENCOUNTER — Emergency Department (HOSPITAL_COMMUNITY)
Admission: EM | Admit: 2018-01-07 | Discharge: 2018-01-07 | Disposition: A | Discharge status: Home / Self Care | Payer: Medicare Other | Attending: Emergency Medicine | Admitting: Emergency Medicine

## 2018-01-07 ENCOUNTER — Encounter (HOSPITAL_COMMUNITY): Payer: Self-pay

## 2018-01-07 ENCOUNTER — Emergency Department (HOSPITAL_BASED_OUTPATIENT_CLINIC_OR_DEPARTMENT_OTHER): Payer: Medicare Other

## 2018-01-07 ENCOUNTER — Other Ambulatory Visit: Payer: Self-pay

## 2018-01-07 DIAGNOSIS — L03116 Cellulitis of left lower limb: Secondary | ICD-10-CM | POA: Diagnosis not present

## 2018-01-07 DIAGNOSIS — R2242 Localized swelling, mass and lump, left lower limb: Secondary | ICD-10-CM | POA: Diagnosis present

## 2018-01-07 DIAGNOSIS — I10 Essential (primary) hypertension: Secondary | ICD-10-CM | POA: Insufficient documentation

## 2018-01-07 DIAGNOSIS — M7989 Other specified soft tissue disorders: Secondary | ICD-10-CM | POA: Diagnosis not present

## 2018-01-07 DIAGNOSIS — Z79899 Other long term (current) drug therapy: Secondary | ICD-10-CM | POA: Insufficient documentation

## 2018-01-07 MED ORDER — CEPHALEXIN 500 MG PO CAPS
500.0000 mg | ORAL_CAPSULE | Freq: Once | ORAL | Status: AC
  Administered 2018-01-07: 500 mg via ORAL
  Filled 2018-01-07: qty 1

## 2018-01-07 MED ORDER — CEPHALEXIN 500 MG PO CAPS: 500.0000 mg | ORAL_CAPSULE | Freq: Three times a day (TID) | ORAL | 0 refills | Status: DC

## 2018-01-07 NOTE — ED Triage Notes (Signed)
Patient c/o left leg swelling x 1 week.

## 2018-01-07 NOTE — ED Provider Notes (Signed)
[facility]-EMERGENCY DEPT Provider Note   CSN: [id number] Arrival date & time: 01/07/18  4098     History   Chief Complaint Chief Complaint  Patient presents with  . Leg Swelling    HPI Darren Shaw is a 77 y.o. male.  HPI   77 year old male with left leg swelling.  Onset approximately 1 week ago.  No trauma.  Swelling goes from just below the knee down to the foot.  No fevers or chills.  Denies any recent prolonged travel/immobilization, surgery, history of PE/DVT or exogenous hormone usage.   Past Medical History:  Diagnosis Date  . Edema of both legs   . Hypertension   . PAD (peripheral artery disease) (HCC)   . Venous intermittent claudication     There are no active problems to display for this patient.   Past Surgical History:  Procedure Laterality Date  . CHOLECYSTECTOMY    . MINOR HEMORRHOIDECTOMY          Home Medications    Prior to Admission medications   Medication Sig Start Date End Date Taking? Authorizing Provider  brimonidine-timolol (COMBIGAN) 0.2-0.5 % ophthalmic solution INSTILL 1 DROP INTO right EYE TWICE DAILY 11/13/16  Yes [provider]  cephALEXin (KEFLEX) 500 MG capsule Take 500 mg by mouth 4 (four) times daily.   Yes [provider]  cholecalciferol (VITAMIN D) 1000 units tablet Take 1,000 Units by mouth daily.   Yes [provider]  metoprolol tartrate (LOPRESSOR) 25 MG tablet Take 25 mg by mouth 2 (two) times daily.   Yes [provider]  Multiple Vitamin (MULTIVITAMIN WITH MINERALS) TABS tablet Take 1 tablet by mouth daily.   Yes [provider]    Family History History reviewed. No pertinent family history.  Social History Social History   Tobacco Use  . Smoking status: Never Smoker  . Smokeless tobacco: Never Used  Substance Use Topics  . Alcohol use: No    Alcohol/week: 0.0 oz  . Drug use: No     Allergies   Patient has no known  allergies.   Review of Systems Review of Systems  All systems reviewed and negative, other than as noted in HPI.   Physical Exam Updated Vital Signs BP 129/70 (BP Location: Left Arm)   Pulse 65   Temp 98.2 F (36.8 C) (Oral)   Resp 18   Ht 5\' 9"  (1.753 m)   Wt 66.2 kg (146 lb)   SpO2 98%   BMI 21.56 kg/m   Physical Exam  Constitutional: He is oriented to person, place, and time. He appears well-developed and well-nourished. No distress.  HENT:  Head: Normocephalic and atraumatic.  Eyes: Conjunctivae are normal. Right eye exhibits no discharge. Left eye exhibits no discharge.  Neck: Neck supple.  Cardiovascular: Normal rate, regular rhythm and normal heart sounds. Exam reveals no gallop and no friction rub.  No murmur heard. Pulmonary/Chest: Effort normal and breath sounds normal. No respiratory distress.  Abdominal: Soft. He exhibits no distension. There is no tenderness.  Musculoskeletal: He exhibits edema and tenderness.  Diffuse swelling of left lower extremity from the proximal shin down into the foot.  There is some mild excoriation to the anterior part of the shin.  There is no abscess.  Warm to touch.  Tender.  Negative Homans.  Palpable DP pulse.  Is able to actively range the ankle and foot without apparent difficulty.  Neurological: He is alert and oriented to person, place, and time.  Skin: Skin is warm and dry.  Psychiatric: He has a normal mood and affect. His behavior is normal. Thought content normal.  Nursing note and vitals reviewed.    ED Treatments / Results  Labs (all labs ordered are listed, but only abnormal results are displayed) Labs Reviewed - No data to display  EKG None  Radiology No results found.  Procedures Procedures (including critical care time)  Medications Ordered in ED Medications - No data to display   Initial Impression / Assessment and Plan / ED Course  I have reviewed the triage vital signs and the nursing  notes.  Pertinent labs & imaging results that were available during my care of the patient were reviewed by me and considered in my medical decision making (see chart for details).     77 year old male with pain, swelling and redness of the left lower extremity.  Ultrasound is negative for DVT.  Suspect that this is actually cellulitis.  Will cover with antibiotics.  He is afebrile.  Generally well-appearing.  I feel is appropriate for outpatient treatment.  Final Clinical Impressions(s) / ED Diagnoses   Final diagnoses:  Cellulitis of left leg    ED Discharge Orders    None       Raeford RazorKohut, Loxley Cibrian, MD 01/11/18 804 [phone]

## 2018-01-07 NOTE — Discharge Instructions (Addendum)
Your ultrasound did not show evidence of a blood clot. I suspect the pain/swelling/redness you are having in your left leg is from cellulitis. Take antibiotics as prescribed until finished. Keep your leg elevated when you are off of it.

## 2018-01-07 NOTE — Progress Notes (Signed)
*  Preliminary Results* Left lower extremity venous duplex completed. Left lower extremity is negative for deep vein thrombosis. There is no evidence of left Baker's cyst.  01/07/2018 11:37 AM  Gertie FeyMichelle Haydn Hutsell, BS, RVT, RDCS, RDMS

## 2018-01-17 ENCOUNTER — Ambulatory Visit
Admission: RE | Admit: 2018-01-17 | Discharge: 2018-01-17 | Disposition: A | Discharge status: Home / Self Care | Payer: Medicare Other | Source: Ambulatory Visit | Attending: Internal Medicine | Admitting: Internal Medicine

## 2018-01-17 ENCOUNTER — Other Ambulatory Visit: Payer: Self-pay | Admitting: Internal Medicine

## 2018-01-17 DIAGNOSIS — M25572 Pain in left ankle and joints of left foot: Secondary | ICD-10-CM

## 2018-02-12 DIAGNOSIS — G4710 Hypersomnia, unspecified: Secondary | ICD-10-CM | POA: Insufficient documentation

## 2018-02-12 DIAGNOSIS — G4733 Obstructive sleep apnea (adult) (pediatric): Secondary | ICD-10-CM | POA: Insufficient documentation

## 2018-02-19 ENCOUNTER — Other Ambulatory Visit: Payer: Self-pay

## 2018-02-20 ENCOUNTER — Ambulatory Visit: Payer: Medicare Other

## 2018-02-20 ENCOUNTER — Ambulatory Visit (INDEPENDENT_AMBULATORY_CARE_PROVIDER_SITE_OTHER): Payer: Medicare Other | Admitting: Podiatry

## 2018-02-20 ENCOUNTER — Encounter: Payer: Self-pay | Admitting: Podiatry

## 2018-02-20 ENCOUNTER — Other Ambulatory Visit: Payer: Self-pay

## 2018-02-20 VITALS — BP 151/74 | HR 71

## 2018-02-20 DIAGNOSIS — M79672 Pain in left foot: Secondary | ICD-10-CM | POA: Diagnosis not present

## 2018-02-20 DIAGNOSIS — R600 Localized edema: Secondary | ICD-10-CM

## 2018-02-20 DIAGNOSIS — M79671 Pain in right foot: Secondary | ICD-10-CM

## 2018-02-21 ENCOUNTER — Telehealth: Payer: Self-pay | Admitting: *Deleted

## 2018-02-21 DIAGNOSIS — R609 Edema, unspecified: Secondary | ICD-10-CM

## 2018-02-21 NOTE — Telephone Encounter (Signed)
-----   Message from Felecia ShellingBrent M Evans, DPM sent at 02/20/2018 11:10 AM EDT ----- Regarding: vascular consult Non urgent vascular consult.   Dx: chronic left lower extremity edema x 3 months.   Thanks, Dr. Logan BoresEvans

## 2018-02-21 NOTE — Telephone Encounter (Addendum)
Faxed referral, demographics to [facility], without clinicals of 02/20/2018.

## 2018-02-24 NOTE — Progress Notes (Signed)
   HPI: 77 year old male presenting today as a new patient with a chief complaint of left ankle pain and swelling that began a few months ago. He also reports associated burning pain to the lateral and medial aspects of the left foot that radiates up to his knee. He denies modifying factors. He has not done anything for treatment. Patient is here for further evaluation and treatment.   Past Medical History:  Diagnosis Date  . Edema of both legs   . Hypertension   . PAD (peripheral artery disease) (HCC)   . Venous intermittent claudication      Physical Exam: General: The patient is alert and oriented x3 in no acute distress.  Dermatology: Skin is warm, dry and supple bilateral lower extremities. Negative for open lesions or macerations.  Vascular: Pitting edema noted to the left lower extremity up to the level of the knee. Palpable pedal pulses bilaterally. Capillary refill within normal limits.  Neurological: Epicritic and protective threshold grossly intact bilaterally.   Musculoskeletal Exam: Range of motion within normal limits to all pedal and ankle joints bilateral. Muscle strength 5/5 in all groups bilateral.   Assessment: 1. LLE edema    Plan of Care:  1. Patient evaluated.  2. Unna boot compression dressing applied. Keep clean, dry and intact for three days.  3. Post op shoe dispensed. Weightbearing as tolerated.  4. Referral placed for Vascular consult.  5. Return to clinic as needed.       Felecia ShellingBrent M. Arizbeth Cawthorn, DPM [facility]  Dr. Felecia ShellingBrent M. Tineshia Becraft, DPM    [address]                                        [place], [place] [id number]                Office [phone]  Fax [phone]

## 2018-02-26 NOTE — Telephone Encounter (Signed)
Chart note done.

## 2018-03-07 ENCOUNTER — Other Ambulatory Visit: Payer: Self-pay

## 2018-03-07 DIAGNOSIS — R609 Edema, unspecified: Secondary | ICD-10-CM

## 2018-04-26 ENCOUNTER — Ambulatory Visit (INDEPENDENT_AMBULATORY_CARE_PROVIDER_SITE_OTHER): Payer: Medicare Other | Admitting: Vascular Surgery

## 2018-04-26 ENCOUNTER — Ambulatory Visit (HOSPITAL_COMMUNITY)
Admission: RE | Admit: 2018-04-26 | Discharge: 2018-04-26 | Disposition: A | Discharge status: Home / Self Care | Payer: Medicare Other | Source: Ambulatory Visit | Attending: Vascular Surgery | Admitting: Vascular Surgery

## 2018-04-26 ENCOUNTER — Encounter: Payer: Self-pay | Admitting: Vascular Surgery

## 2018-04-26 VITALS — BP 122/66 | HR 62 | Temp 98.3°F | Resp 16 | Ht 65.0 in | Wt 155.6 lb

## 2018-04-26 DIAGNOSIS — R609 Edema, unspecified: Secondary | ICD-10-CM

## 2018-04-26 NOTE — Progress Notes (Signed)
Patient ID: Darren Shaw, male   DOB: 1940-07-24, 77 y.o.   MRN: [id number]  Reason for Consult: Leg Swelling (left leg )   Referred by Leilani Able, MD  Subjective:     HPI:  Darren Shaw is a 77 y.o. male here for evaluation left lower extremity edema.  He states this started suddenly 6 months ago.  He did not note any injuries.  He has never had a DVT that he knew of.  Also had some swelling of his right lower extremity.  He has worn compression socks knee-high but these have worn out at this time.  He is able to walk.  He states the swelling is worse later in the day than early.  He has not had any wounds.  He has taken a fluid pill that has helped him.  He is able to walk without limitation.  Past Medical History:  Diagnosis Date  . Edema of both legs   . Hypertension   . PAD (peripheral artery disease) (HCC)   . Venous intermittent claudication    History reviewed. No pertinent family history. Past Surgical History:  Procedure Laterality Date  . CHOLECYSTECTOMY    . MINOR HEMORRHOIDECTOMY      Short Social History:  Social History   Tobacco Use  . Smoking status: Never Smoker  . Smokeless tobacco: Never Used  Substance Use Topics  . Alcohol use: No    Alcohol/week: 0.0 standard drinks    No Known Allergies  Current Outpatient Medications  Medication Sig Dispense Refill  . brimonidine-timolol (COMBIGAN) 0.2-0.5 % ophthalmic solution INSTILL 1 DROP INTO right EYE TWICE DAILY    . cholecalciferol (VITAMIN D) 1000 units tablet Take 1,000 Units by mouth daily.    . furosemide (LASIX) 20 MG tablet     . losartan (COZAAR) 25 MG tablet Take by mouth.    . losartan-hydrochlorothiazide (HYZAAR) 50-12.5 MG tablet     . methylPREDNISolone (MEDROL DOSEPAK) 4 MG TBPK tablet     . metoprolol tartrate (LOPRESSOR) 25 MG tablet Take 25 mg by mouth 2 (two) times daily.    . Multiple Vitamin (MULTIVITAMIN WITH MINERALS) TABS tablet Take 1 tablet by mouth daily.    . potassium  chloride (K-DUR) 10 MEQ tablet     . prednisoLONE acetate (PRED FORTE) 1 % ophthalmic suspension     . tamsulosin (FLOMAX) 0.4 MG CAPS capsule      No current facility-administered medications for this visit.     Review of Systems  Constitutional:  Constitutional negative. HENT: HENT negative.  Eyes: Positive for visual disturbance.   Respiratory: Respiratory negative.  Cardiovascular: Positive for leg swelling.  GI: Gastrointestinal negative.  Musculoskeletal: Musculoskeletal negative.  Skin: Skin negative.  Neurological: Positive for numbness.  Hematologic: Hematologic/lymphatic negative.  Psychiatric: Psychiatric negative.        Objective:  Objective   Vitals:   04/26/18 1513  BP: 122/66  Pulse: 62  Resp: 16  Temp: 98.3 F (36.8 C)  TempSrc: Oral  Weight: 155 lb 9.6 oz (70.6 kg)  Height: 5\' 5"  (1.651 m)   Body mass index is 25.89 kg/m.  Physical Exam  Constitutional: He is oriented to person, place, and time. He appears well-developed.  HENT:  Head: Normocephalic.  Eyes: Pupils are equal, round, and reactive to light.  Neck: Normal range of motion. Neck supple.  Cardiovascular: Normal rate.  Pulses:      Radial pulses are 2+ on the right side, and 2+ on the  left side.       Popliteal pulses are 2+ on the right side, and 2+ on the left side.  Pulmonary/Chest: Effort normal.  Abdominal: Soft.  Musculoskeletal:  Left ankle is edematous without bony structure identifiable given swelling there are no skin changes  Neurological: He is alert and oriented to person, place, and time.  Skin: Skin is warm and dry.  Psychiatric: He has a normal mood and affect. His behavior is normal. Judgment and thought content normal.    Data:  I have independently interpreted his venous reflux study which demonstrates common femoral venous reflux on the left at 1533 ms his greater saphenous vein measures 0.344 cm in the mid thigh he does not have any saphenous vein reflux.       Assessment/Plan:    76 year old male presents with isolated left lower extremity swelling.  This has gotten some better in 6 months since it started he states with compression stockings and Lasix prescription.  At this time I do not see any veins to intervene upon.  He needs continued compression stockings we discussed elevating his legs when recumbent.  He can follow-up on an as-needed basis.      Maeola Harman MD [facility]

## 2019-09-07 ENCOUNTER — Ambulatory Visit: Payer: Medicare Other | Attending: Internal Medicine

## 2019-09-07 DIAGNOSIS — Z23 Encounter for immunization: Secondary | ICD-10-CM | POA: Insufficient documentation

## 2019-09-07 NOTE — Progress Notes (Signed)
   Covid-19 Vaccination Clinic  Name:  Darren Shaw    MRN: [id number] DOB: 1940/07/25  09/07/2019  [name] was observed post Covid-19 immunization for 15 minutes without incident. He was provided with Vaccine Information Sheet and instruction to access the V-Safe system.   [name] was instructed to call 911 with any severe reactions post vaccine: Marland Kitchen Difficulty breathing  . Swelling of face and throat  . A fast heartbeat  . A bad rash all over body  . Dizziness and weakness   Immunizations Administered    Name Date Dose VIS Date Route   Pfizer COVID-19 Vaccine 09/07/2019 10:55 AM 0.3 mL 06/13/2019 Intramuscular   Manufacturer: [employer], [employer]   Lot: [id number]   NDC: [address]-9

## 2019-10-02 ENCOUNTER — Other Ambulatory Visit: Payer: Self-pay | Admitting: Internal Medicine

## 2019-10-02 DIAGNOSIS — I739 Peripheral vascular disease, unspecified: Secondary | ICD-10-CM

## 2019-10-07 ENCOUNTER — Ambulatory Visit: Payer: Medicare Other | Attending: Internal Medicine

## 2019-10-07 DIAGNOSIS — Z23 Encounter for immunization: Secondary | ICD-10-CM

## 2019-10-07 NOTE — Progress Notes (Signed)
   Covid-19 Vaccination Clinic  Name:  Darren Shaw    MRN: [id number] DOB: 10/17/1940  10/07/2019  [name] was observed post Covid-19 immunization for 15 minutes without incident. He was provided with Vaccine Information Sheet and instruction to access the V-Safe system.   [name] was instructed to call 911 with any severe reactions post vaccine: Marland Kitchen Difficulty breathing  . Swelling of face and throat  . A fast heartbeat  . A bad rash all over body  . Dizziness and weakness   Immunizations Administered    Name Date Dose VIS Date Route   Pfizer COVID-19 Vaccine 10/07/2019 11:52 AM 0.3 mL 06/13/2019 Intramuscular   Manufacturer: [employer], [employer]   Lot: [id number]   NDC: [address]-9

## 2019-10-09 ENCOUNTER — Ambulatory Visit: Payer: Medicare Other

## 2019-10-09 ENCOUNTER — Other Ambulatory Visit: Payer: Self-pay

## 2019-10-09 DIAGNOSIS — I739 Peripheral vascular disease, unspecified: Secondary | ICD-10-CM

## 2019-11-27 ENCOUNTER — Ambulatory Visit: Payer: Medicare Other | Admitting: Cardiology

## 2020-02-13 IMAGING — MR MR LUMBAR SPINE W/O CM
5 series · 42 of 48 positions shown · non-contrast
Comparison: None.

CLINICAL DATA: Low back pain extending into the lower extremities
bilaterally to the toes. Right upper extremity pain. Bilateral lower
extremity weakness and numbness.

EXAM:
MRI LUMBAR SPINE WITHOUT CONTRAST
TECHNIQUE: Multiplanar, multisequence MR imaging of the lumbar spine was
performed. No intravenous contrast was administered.

[Series 3: T2 post-contrast · sagittal · 4.0mm · 0.88mm/px · 6 of 15 slices shown]
[im 1/15]
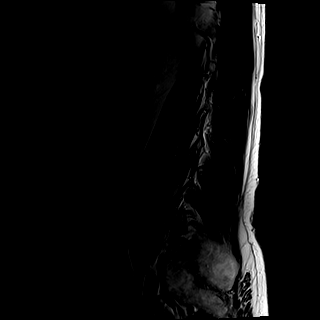
[im 3/15]
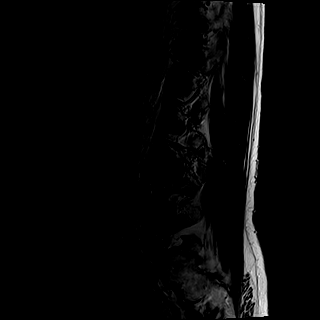
[im 6/15]
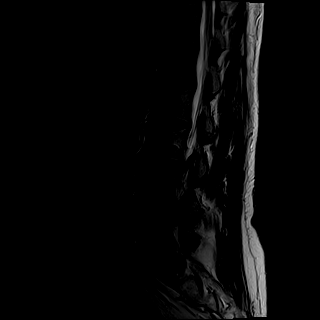
[im 9/15]
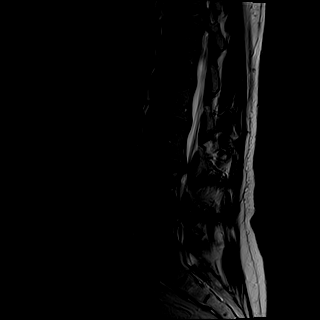
[im 12/15]
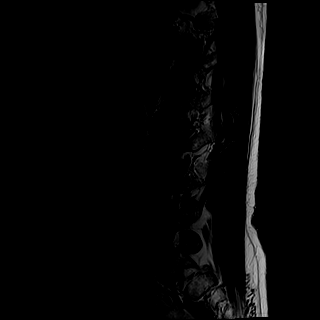
[im 15/15]
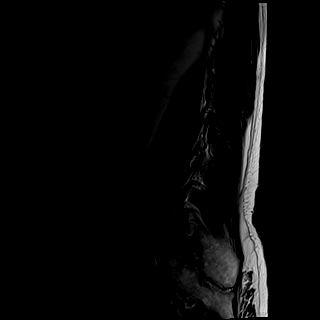

[Series 4: T1 · sagittal · 4.0mm · 0.88mm/px · 6 of 15 slices shown (1 of 2)]
[im 1/15]
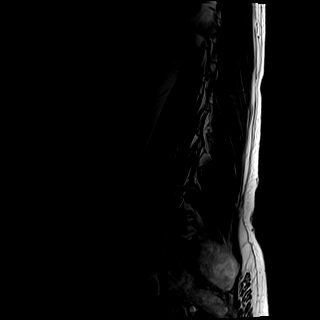
[im 3/15]
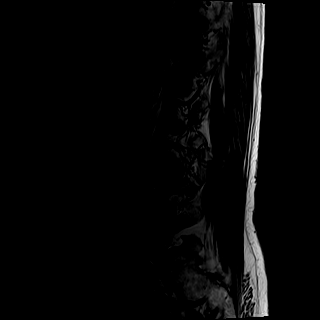
[im 6/15]
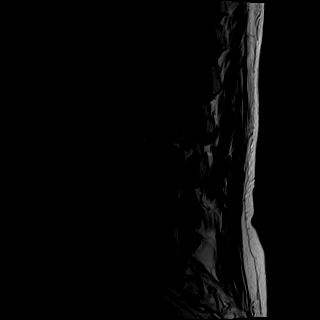
[im 9/15]
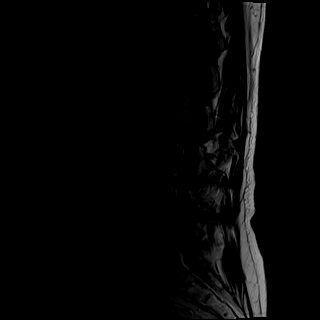
[im 12/15]
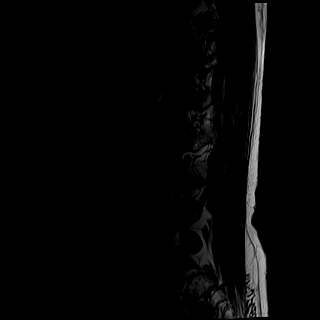
[im 15/15]
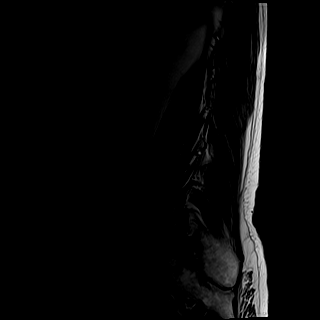

[Series 5: tirm sag · sagittal · 4.0mm · 0.55mm/px · 6 of 15 slices shown]
[im 1/15]
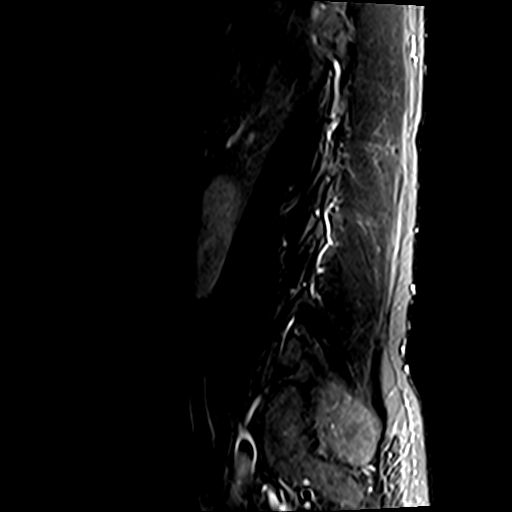
[im 3/15]
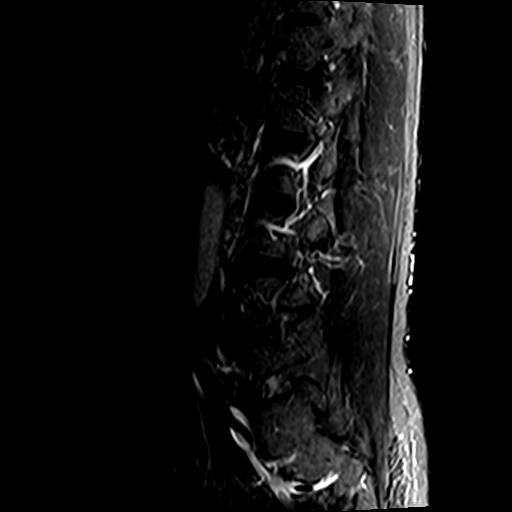
[im 6/15]
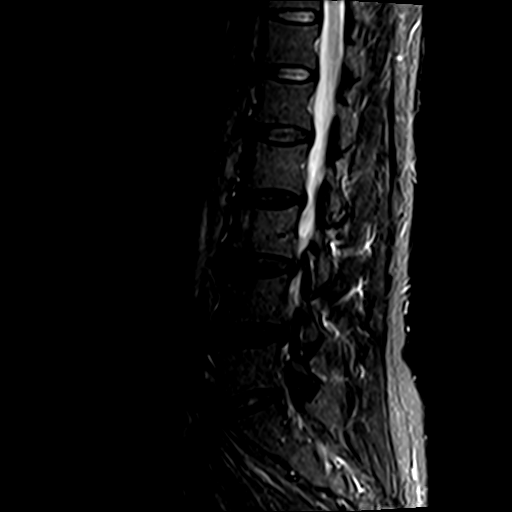
[im 9/15]
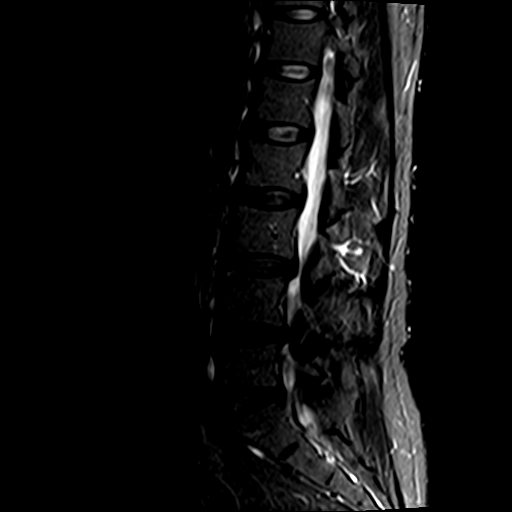
[im 12/15]
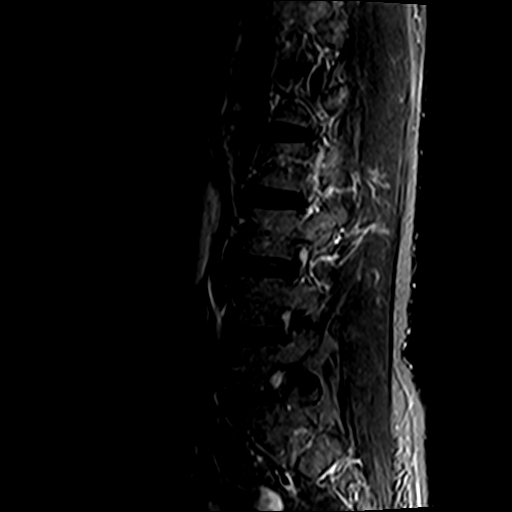
[im 15/15]
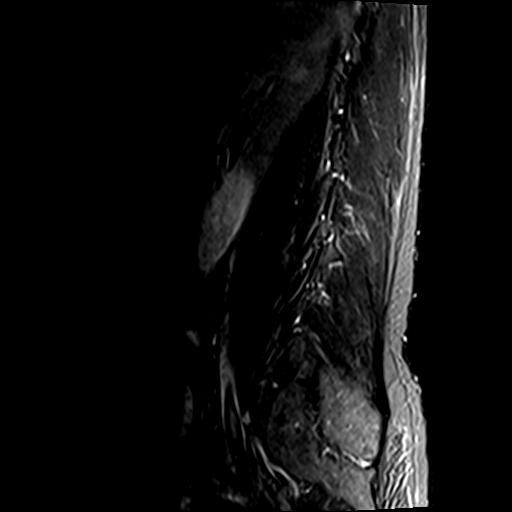

[Series 6: T1 · axial · 4.0mm · 0.78mm/px · z∈[-77,+137]mm · 9 of 40 slices shown (2 of 2)]
[im 1/40]
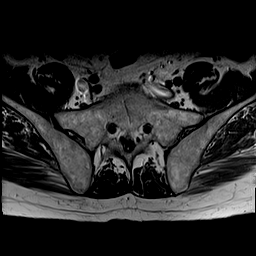
[im 6/40]
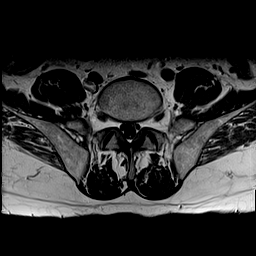
[im 12/40]
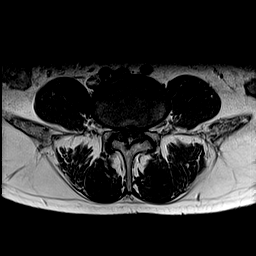
[im 17/40]
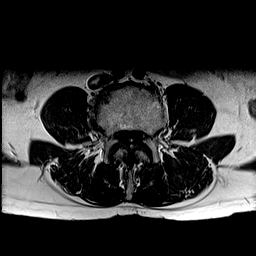
[im 20/40]
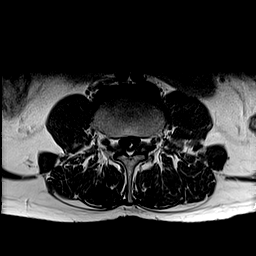
[im 23/40]
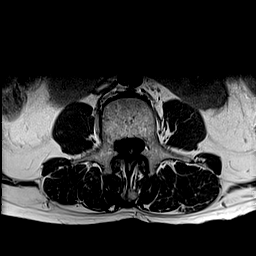
[im 28/40]
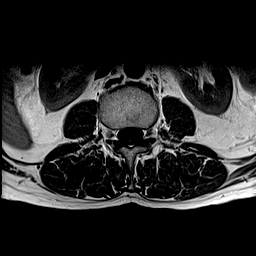
[im 34/40]
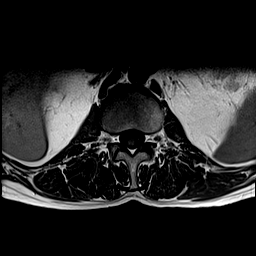
[im 40/40]
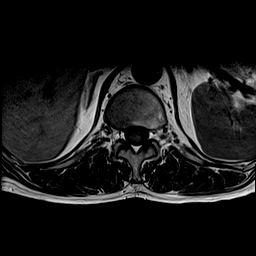

[Series 7: T2 · axial · 4.0mm · 0.78mm/px · z∈[-77,+137]mm · 15 of 40 slices shown]
[im 1/40]
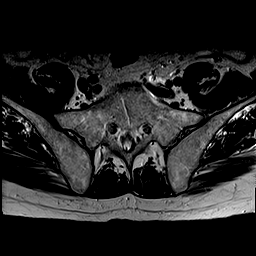
[im 3/40]
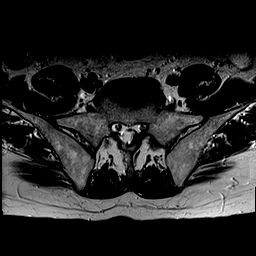
[im 6/40]
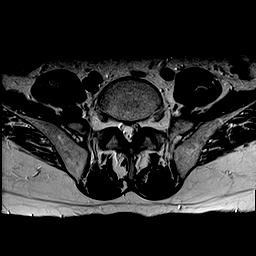
[im 9/40]
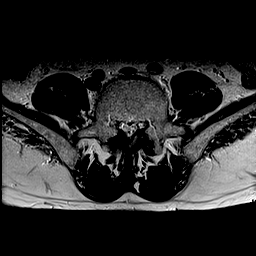
[im 12/40]
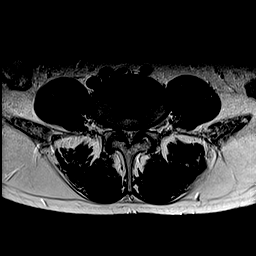
[im 14/40]
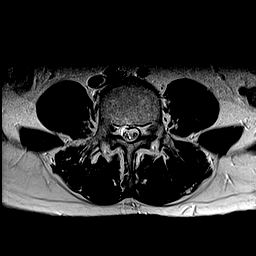
[im 17/40]
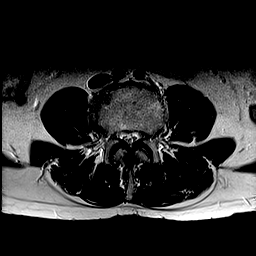
[im 20/40]
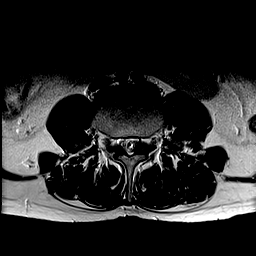
[im 23/40]
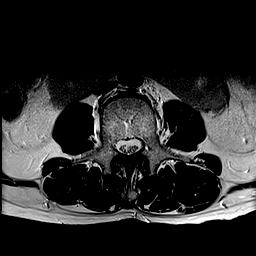
[im 26/40]
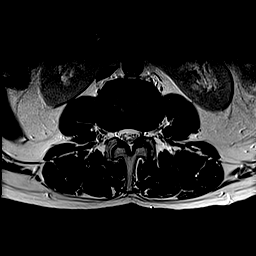
[im 28/40]
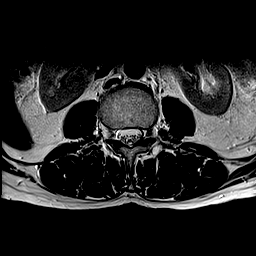
[im 31/40]
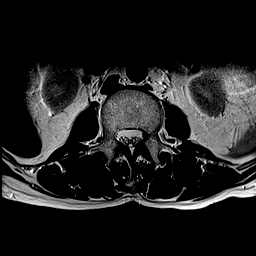
[im 34/40]
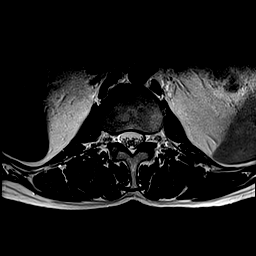
[im 37/40]
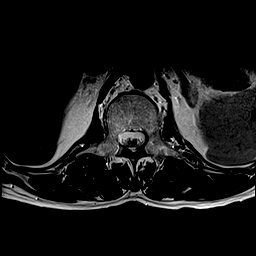
[im 40/40]
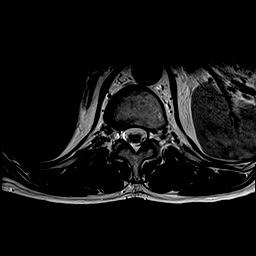

[42 of 48 positions shown; findings below may reference images not displayed]

FINDINGS: Segmentation: 5 non rib-bearing lumbar type vertebral bodies are
present.

Alignment: AP alignment is anatomic. There is straightening of the
normal lumbar lordosis.

Vertebrae:  Marrow signal and vertebral body heights are normal.

Conus medullaris and cauda equina: Conus extends to the L1-2 level.
Conus and cauda equina appear normal.

Paraspinal and other soft tissues: Limited imaging of the abdomen
demonstrates a horseshoe kidney. Atherosclerotic changes are present
in the aorta and branch vessels. No aneurysm. No significant
adenopathy is present. Paraspinous musculature is within normal
limits.

Disc levels:

L1-2: Negative.

L2-3: Mild disc bulging is present. A central annular tear is
present. No compressive stenosis is present.

L3-4: A broad-based disc protrusion is present. Mild facet
hypertrophy is noted bilaterally. Mild subarticular narrowing is
worse on the right. Mild bilateral foraminal stenosis is present.

L4-5: A broad-based disc protrusion is present. A central annular
tear is noted. Moderate facet hypertrophy is noted bilaterally. The
combination results in severe central canal stenosis. Mild to
moderate foraminal narrowing is worse on the right.

L5-S1: A shallow central disc protrusion and annular tear are
present. Mild facet hypertrophy is noted. There is no focal
stenosis.
IMPRESSION: 1. Severe central canal stenosis at L4-5 secondary to a broad-based
disc protrusion and bilateral facet hypertrophy.
2. Mild to moderate foraminal narrowing bilaterally at L4-5 is worse
on the right.
3. Mild subarticular and foraminal narrowing bilaterally at L3-4 is
worse on the right.
4. Mild disc bulging and central annular tear at L2-3 without focal
stenosis.
5. Shallow central disc protrusion and annular tear at L5-S1 without
focal stenosis.
